# Patient Record
Sex: Female | Born: 1990 | Race: White | Marital: Single
Health system: Northeastern US, Community
[De-identification: ages and names within clinical notes are randomized; demographics above are authoritative.]

---

## 2015-06-24 ENCOUNTER — Emergency Department (HOSPITAL_BASED_OUTPATIENT_CLINIC_OR_DEPARTMENT_OTHER)
Admission: RE | Admit: 2015-06-24 | Disposition: A | Discharge status: Home / Self Care | Payer: Self-pay | Source: Emergency Department | Attending: Emergency Medicine | Admitting: Emergency Medicine

## 2015-06-24 MED ORDER — TETANUS-DIPHTH-ACELL PERTUSSIS 5-2.5-18.5 LF-MCG/0.5 IM SUSP
0.50 mL | Freq: Once | INTRAMUSCULAR | Status: AC
Start: 2015-06-24 — End: 2015-06-24
  Administered 2015-06-24: 0.5 mL via INTRAMUSCULAR
  Filled 2015-06-24: qty 0.5

## 2015-06-24 NOTE — Narrator Note (Signed)
Patient Disposition    Patient education for diagnosis, medications, activity, diet and follow-up.  Patient left ED 11:54 PM.  Patient rep received written instructions.  Interpreter to provide instructions: No    Patient belongings with patient: YES    Have all existing LDAs been addressed? N/A    Have all IV infusions been stopped? N/A    Discharged to: Discharged to home.  D/C instructions reviewed.  Pt voiced understanding.

## 2015-06-24 NOTE — ED Triage Note (Signed)
Ambulatory to ED requesting tetanus shot after cutting her hand on rusty fence today.  Pt unsure of when her last tetanus shot was.

## 2015-06-24 NOTE — ED Notes (Signed)
Bed: 23  Expected date:   Expected time:   Means of arrival:   Comments:  lorie

## 2015-06-24 NOTE — ED Provider Notes (Signed)
I have reviewed the ED nursing notes and prior records. I have reviewed the patient's past medical history/problem list, allergies, social history and medication list. I saw this patient primarily.    HPI:    This 24 year old female presents with chief complaint of puncture wound left hand.  Patient states that she was climbing over a fence and sustained a puncture to the palmar surface.  Patient states she cleanse this but is unsure of her tetanus immunization status.    ROS: Pertinent positives were reviewed as per the HPI above.    Past Medical History/Problem List:  No past medical history on file.  There is no problem list on file for this patient.    Past Surgical History:  No past surgical history on file.  Medications:     No current facility-administered medications on file prior to encounter.   No current outpatient prescriptions on file prior to encounter.  Social History:     Marital status: Single  Spouse name: N/A     Allergies:  Review of Patient's Allergies indicates:  No Known Allergies  Physical Exam:  BP 123/80  Pulse 97  Temp 98.3 F  Resp 16  LMP 06/13/2015 (Exact Date)  SpO2 97%  GENERAL:  Well-developed, alert & oriented x 4, no distress  SKIN:  Warm & Dry, no rash, no bruising.  There is a small puncture wound without active bleeding to the lower mid medial palmar surface.  No noted foreign body.  HEAD:  Atraumatic. PERRL.   NEUROLOGIC:  Normal speech. Normal gait and station.  No focal deficits.  PSYCHIATRIC:  Normal affect    ED Course and Medical Decision-making:  Patient is a [age] show female who sustained a puncture to her left palm.  Wound was cleansed.  Antibiotic ointment dressing was applied.  Patient was given TD IM and will follow-up with PCP or return here if problems.  Discharged with written instructions.  Reasons to return to the ED were reviewed in detail. The patient agrees with this plan and disposition.    Diagnosis/Diagnoses:  Puncture wound, hand, left, initial  encounter    Verna Czechhomas Danaisha Celli M.D.

## 2015-06-24 NOTE — Discharge Instructions (Signed)
Puncture Wound  A puncture wound is an injury that extends through all layers of the skin and into the tissue beneath the skin (subcutaneous tissue). Puncture wounds become infected easily because germs often enter the body and go beneath the skin during the injury. Having a deep wound with a small entrance point makes it difficult for your caregiver to adequately clean the wound. This is especially true if you have stepped on a nail and it has passed through a dirty shoe or other situations where the wound is obviously contaminated.  CAUSES   Many puncture wounds involve glass, nails, splinters, fish hooks, or other objects that enter the skin (foreign bodies). A puncture wound may also be caused by a human bite or animal bite.  DIAGNOSIS   A puncture wound is usually diagnosed by your history and a physical exam. You may need to have an X-ray or an ultrasound to check for any foreign bodies still in the wound.  TREATMENT    Your caregiver will clean the wound as thoroughly as possible. Depending on the location of the wound, a bandage (dressing) may be applied.   Your caregiver might prescribe antibiotic medicines.   You may need a follow-up visit to check on your wound. Follow all instructions as directed by your caregiver.  HOME CARE INSTRUCTIONS    Change your dressing once per day, or as directed by your caregiver. If the dressing sticks, it may be removed by soaking the area in water.   If your caregiver has given you follow-up instructions, it is very important that you return for a follow-up appointment. Not following up as directed could result in a chronic or permanent injury, pain, and disability.   Only take over-the-counter or prescription medicines for pain, discomfort, or fever as directed by your caregiver.   If you are given antibiotics, take them as directed. Finish them even if you start to feel better.  You may need a tetanus shot if:   You cannot remember when you had your last tetanus  shot.   You have never had a tetanus shot.  If you got a tetanus shot, your arm may swell, get red, and feel warm to the touch. This is common and not a problem. If you need a tetanus shot and you choose not to have one, there is a rare chance of getting tetanus. Sickness from tetanus can be serious.  You may need a rabies shot if an animal bite caused your puncture wound.  SEEK MEDICAL CARE IF:    You have redness, swelling, or increasing pain in the wound.   You have red streaks going away from the wound.   You notice a bad smell coming from the wound or dressing.   You have yellowish-white fluid (pus) coming from the wound.   You are treated with an antibiotic for infection, but the infection is not getting better.   You notice something in the wound, such as rubber from your shoe, cloth, or another object.   You have a fever.   You have severe pain.   You have difficulty breathing.   You feel dizzy or faint.   You cannot stop vomiting.   You lose feeling, develop numbness, or cannot move a limb below the wound.   Your symptoms worsen.  MAKE SURE YOU:   Understand these instructions.   Will watch your condition.   Will get help right away if you are not doing well or get worse.       This information is not intended to replace advice given to you by your health care provider. Make sure you discuss any questions you have with your health care provider.     Document Released: 05/15/2005 Document Revised: 10/28/2011 Document Reviewed: 09/28/2014  [employer] Interactive Patient Education 2016 [employer]

## 2022-02-06 IMAGING — MR MR SACRUM / SI JOINTS WO/W CM
6 of 8 series · 34 of 48 positions shown · IV contrast (multihance)
Comparison: None Available.

CLINICAL DATA: Left-sided back pain with left thigh numbness and
weakness after tennis injury 2 months ago. No prior surgery.

EXAM:
MRI SACRUM WITHOUT AND WITH CONTRAST
TECHNIQUE: Multiplanar and multiecho pulse sequences of the sacrum were
obtained without and with intravenous contrast.
CONTRAST:  13mL MULTIHANCE GADOBENATE DIMEGLUMINE 529 MG/ML IV SOLN

[Series 2: T1 · axial · right · 4.0mm · 0.52mm/px · z∈[-29,+116]mm · 7 of 30 slices shown (1 of 2)]
[im 1/30]
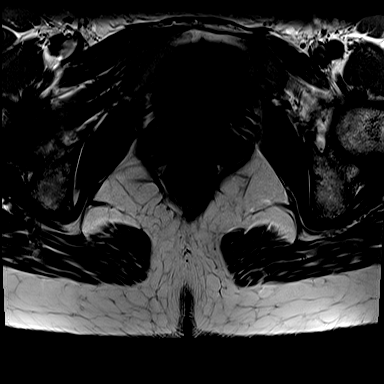
[im 5/30]
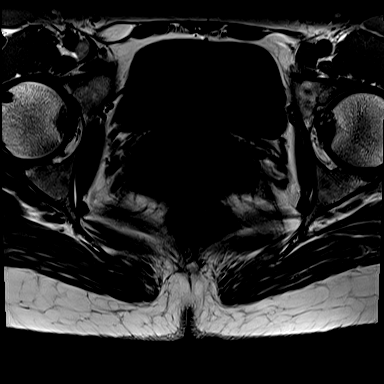
[im 10/30]
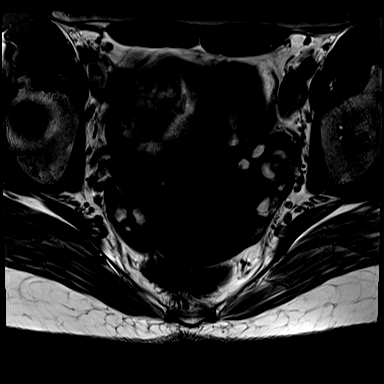
[im 15/30]
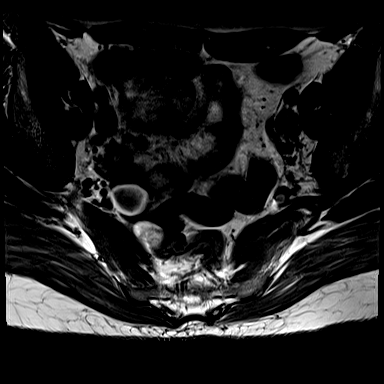
[im 20/30]
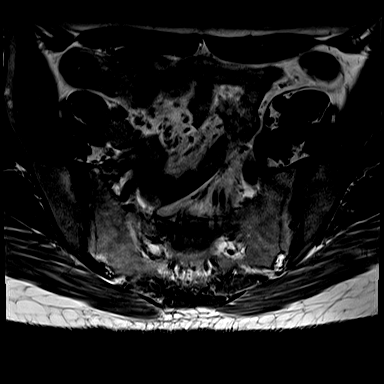
[im 25/30]
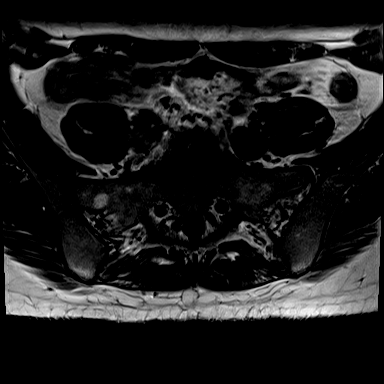
[im 30/30]
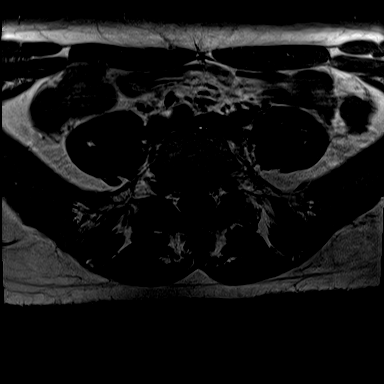

[Series 3: T2 fat-sat · axial · right · 4.0mm · 0.52mm/px · z∈[-38,+125]mm · 7 of 30 slices shown (1 of 2)]
[im 1/30]
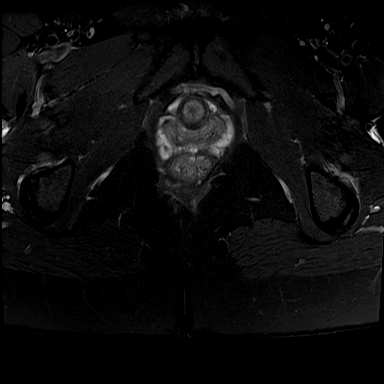
[im 5/30]
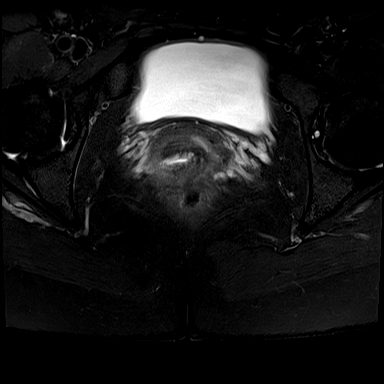
[im 10/30]
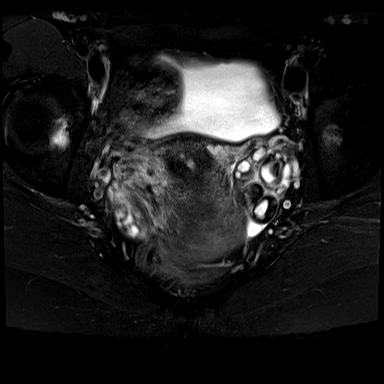
[im 15/30]
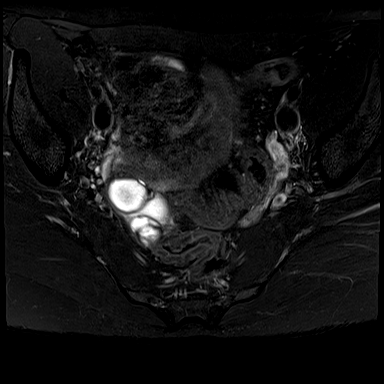
[im 20/30]
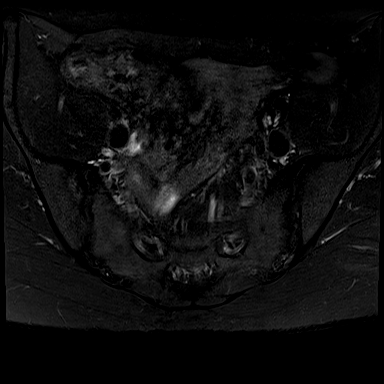
[im 25/30]
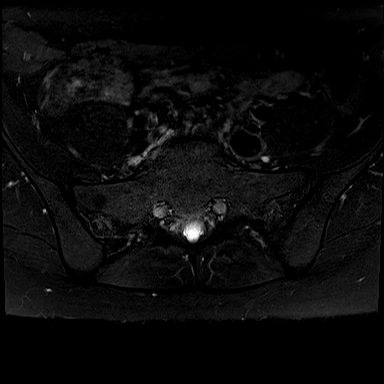
[im 30/30]
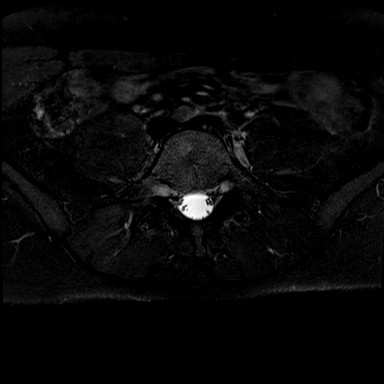

[Series 4: T2 fat-sat · sagittal · right · 4.0mm · 0.75mm/px · 7 of 30 slices shown (2 of 2)]
[im 1/30]
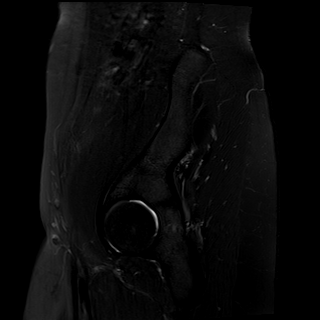
[im 5/30]
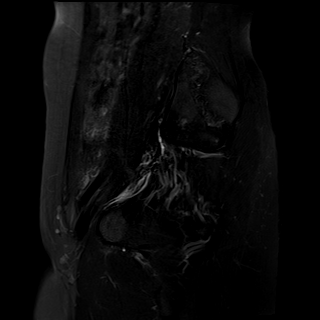
[im 10/30]
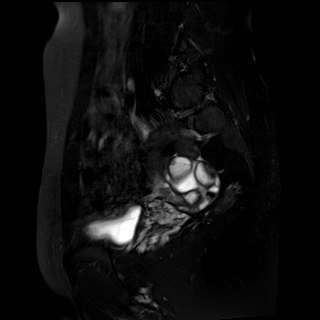
[im 15/30]
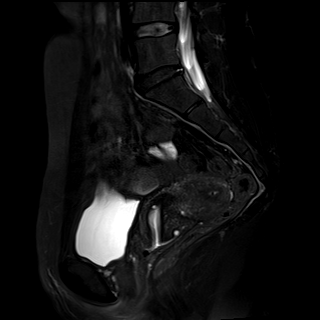
[im 20/30]
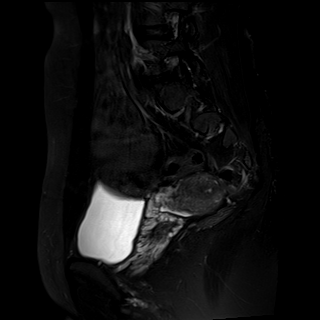
[im 25/30]
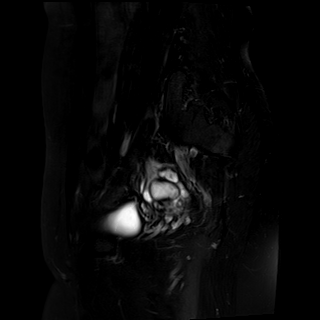
[im 30/30]
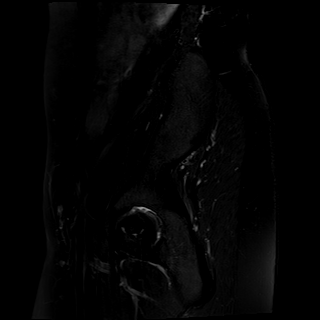

[Series 5: T1 · oblique · right · 4.0mm · 0.49mm/px · 5 of 25 slices shown (2 of 2)]
[im 1/25]
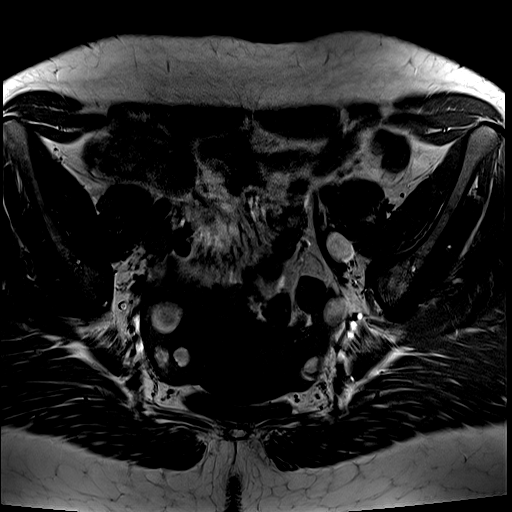
[im 7/25]
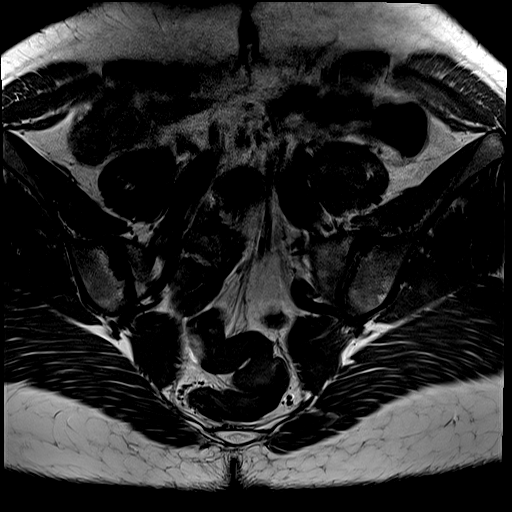
[im 13/25]
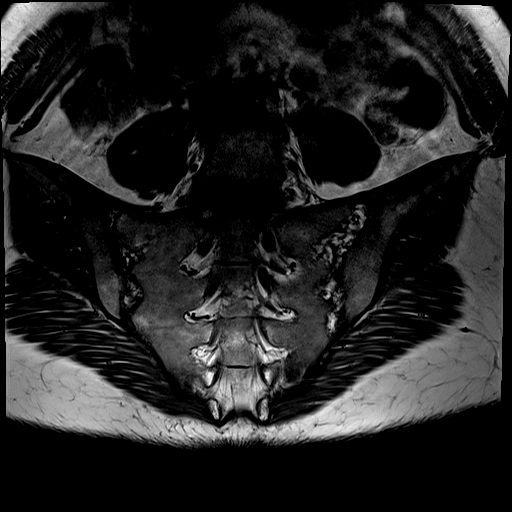
[im 19/25]
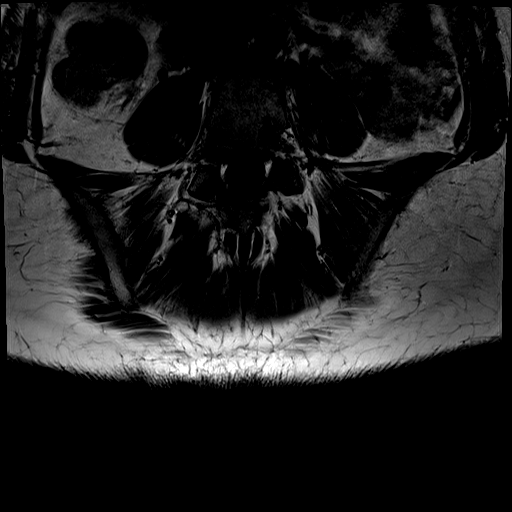
[im 25/25]
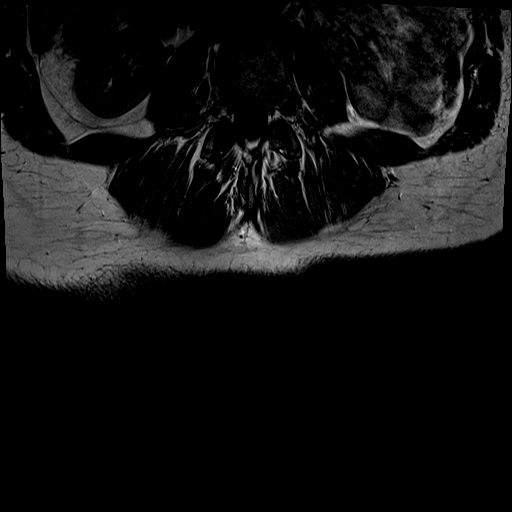

[Series 6: STIR · oblique · right · 4.0mm · 0.88mm/px · 5 of 25 slices shown]
[im 1/25]
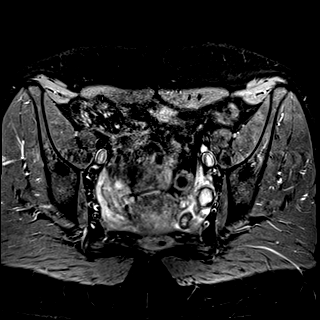
[im 7/25]
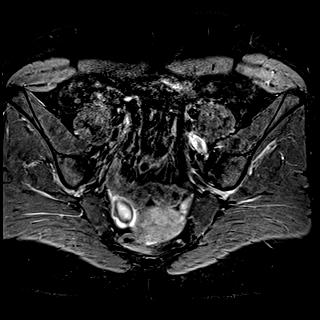
[im 13/25]
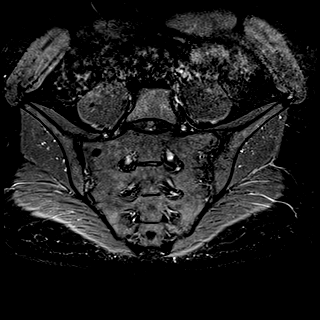
[im 19/25]
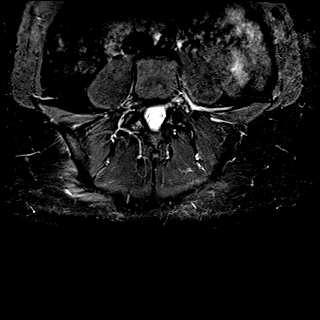
[im 25/25]
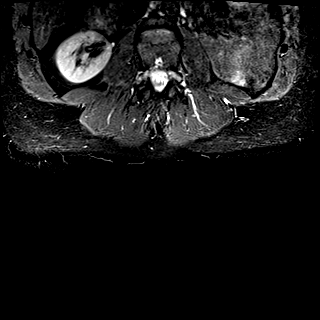

[Series 7: T1 fat-sat · axial · right · 4.0mm · 0.52mm/px · z∈[-29,+26]mm · 3 of 30 slices shown]
[im 1/30]
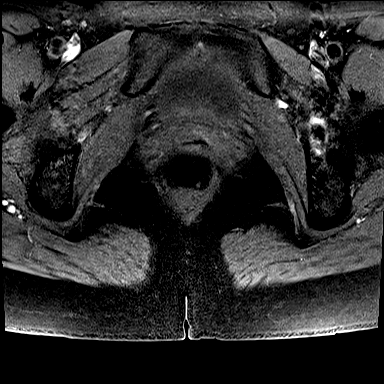
[im 6/30]
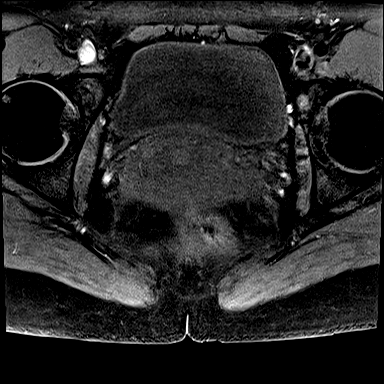
[im 12/30]
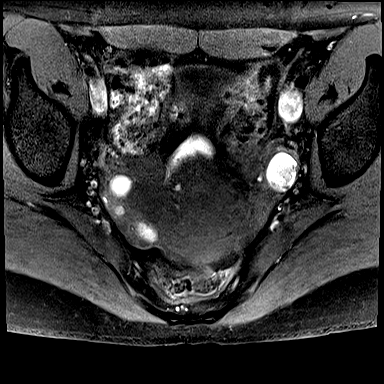

[34 of 48 positions shown; findings below may reference images not displayed]

FINDINGS: Bones/Joint/Cartilage

No suspicious marrow signal abnormality. No fracture or dislocation.
Very mild degenerative changes of the right sacroiliac joint
anteriorly with trace periarticular marrow edema. No joint effusion.
Small hemangioma in the right sacral ala.

Muscles and Tendons
Intact.  No muscle edema or atrophy.

Soft tissue
No fluid collection or hematoma. No soft tissue mass. Retroverted
uterus with appropriately positioned IUD.
IMPRESSION: 1. No acute abnormality of the sacrum.
2. Early degenerative changes of the right sacroiliac joint.

## 2022-02-06 IMAGING — MR MR LUMBAR SPINE WO/W CM
4 of 7 series · 19 of 48 positions shown · IV contrast (multihance)
Comparison: MRI lumbar spine report dated [DATE].

CLINICAL DATA: Left-sided back pain with left thigh numbness and
weakness after tennis injury 2 months ago. No prior surgery.

EXAM:
MRI LUMBAR SPINE WITHOUT AND WITH CONTRAST
TECHNIQUE: Multiplanar and multiecho pulse sequences of the lumbar spine were
obtained without and with intravenous contrast.
CONTRAST:  13mL MULTIHANCE GADOBENATE DIMEGLUMINE 529 MG/ML IV SOLN

[Series 5: T1 · sagittal · right · 4.0mm · 0.73mm/px · 3 of 15 slices shown (1 of 2)]
[im 1/15]
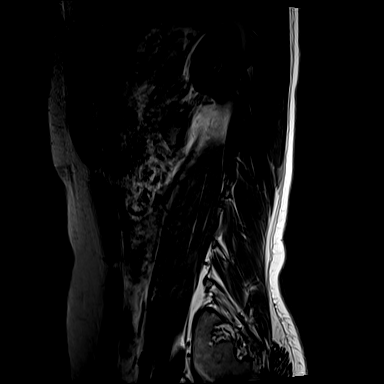
[im 8/15]
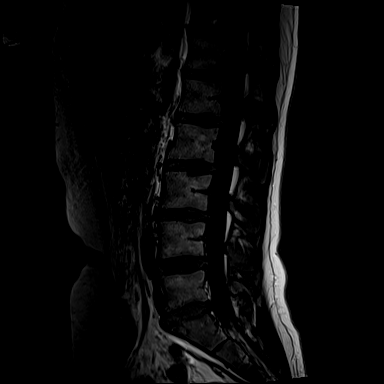
[im 15/15]
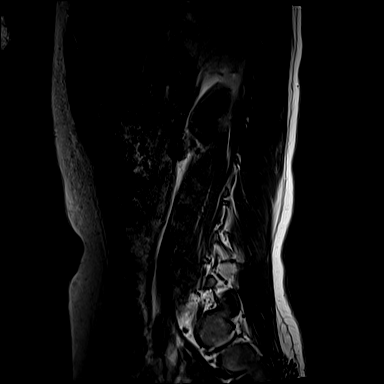

[Series 9: T1 · axial · right · 4.0mm · 0.28mm/px · z∈[+85,+273]mm · 3 of 44 slices shown (2 of 2)]
[im 5/44]
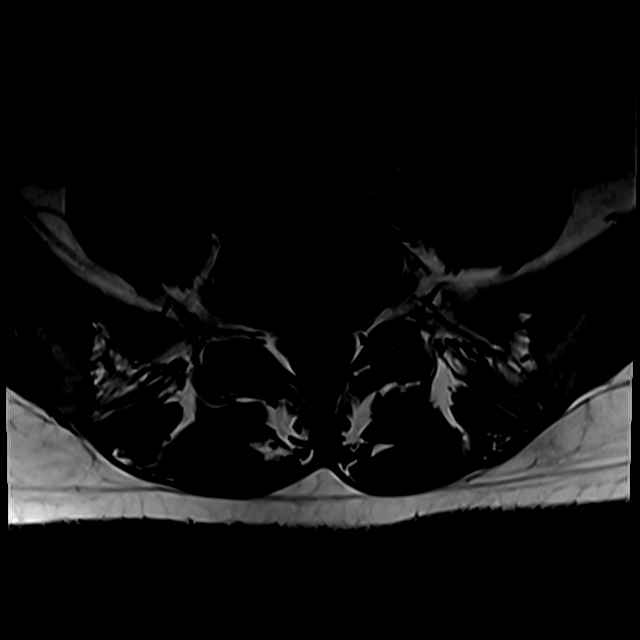
[im 22/44]
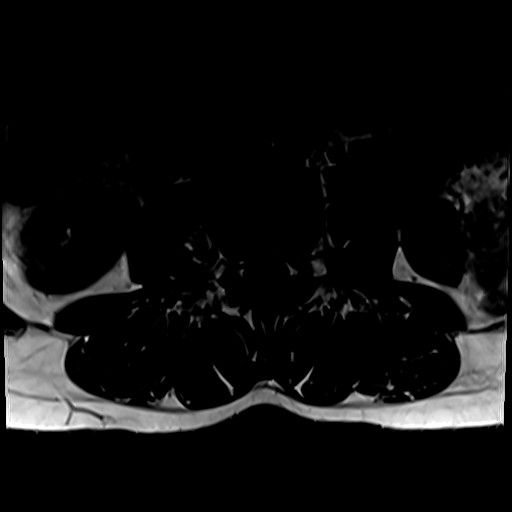
[im 39/44]
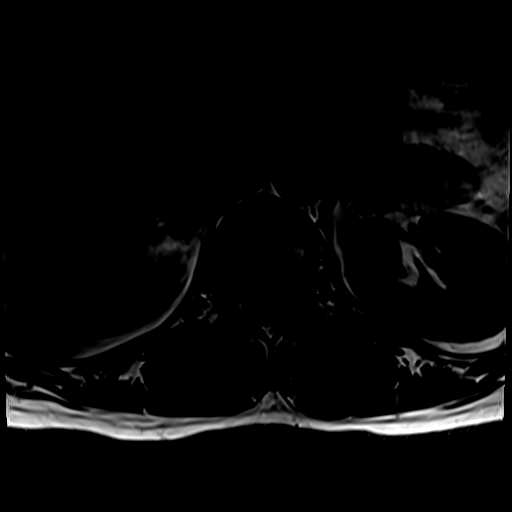

[Series 12: T2 · axial · right · 4.0mm · 0.28mm/px · z∈[+66,+273]mm · 10 of 44 slices shown (1 of 2)]
[im 1/44]
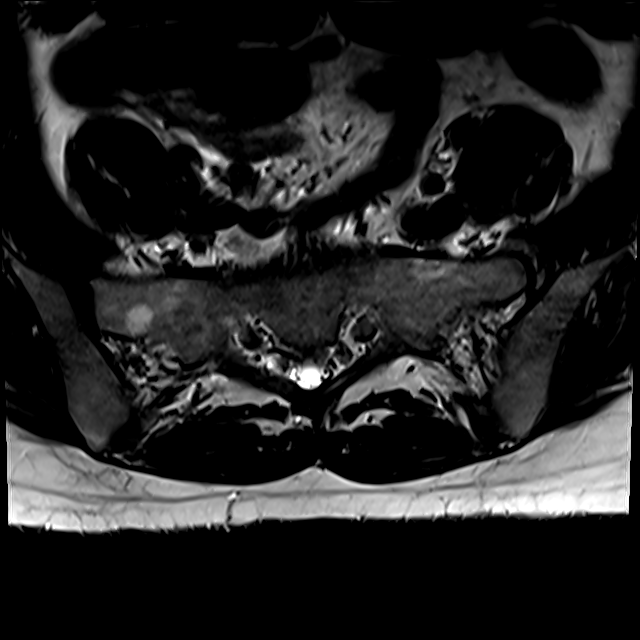
[im 5/44]
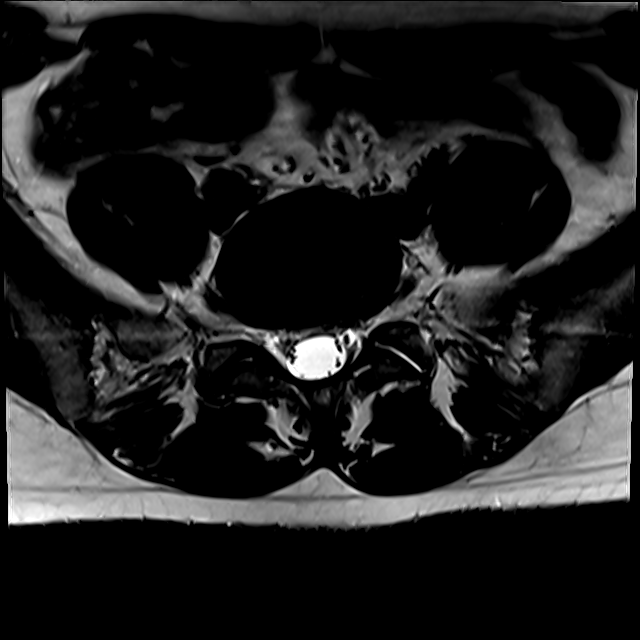
[im 9/44]
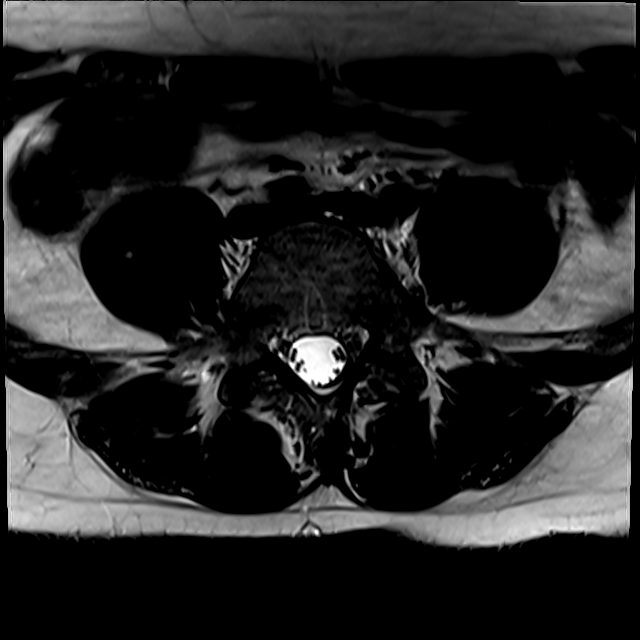
[im 13/44]
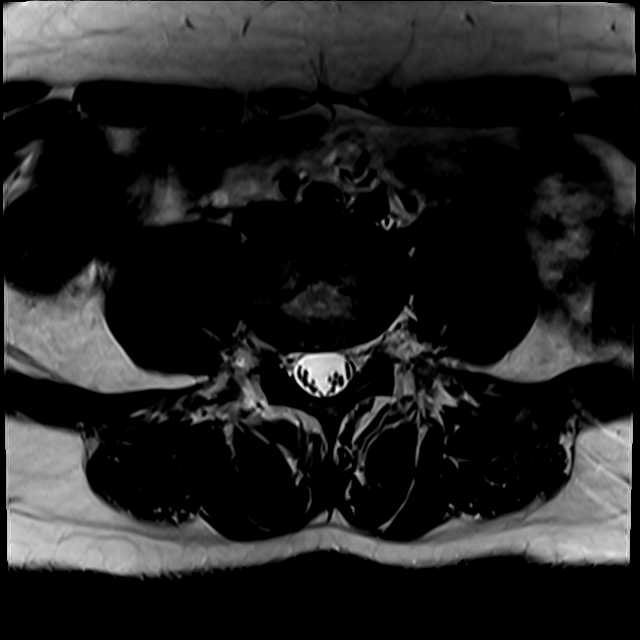
[im 18/44]
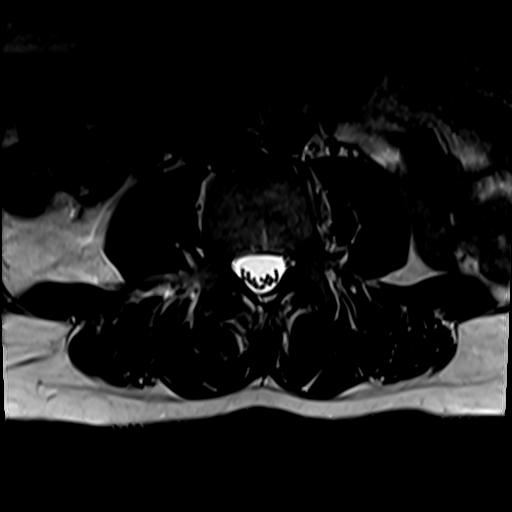
[im 22/44]
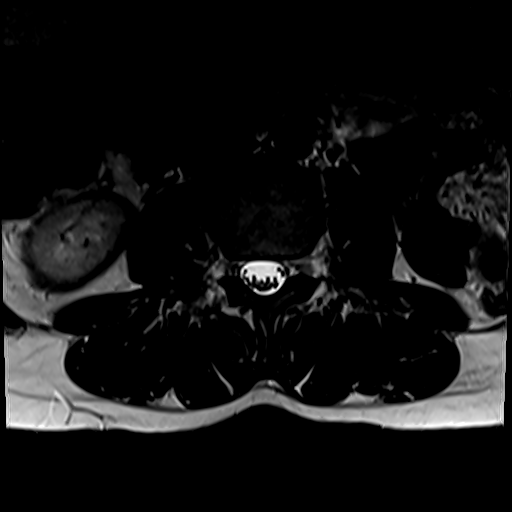
[im 26/44]
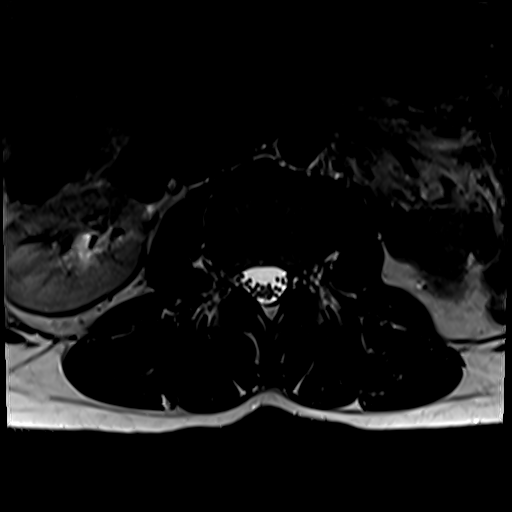
[im 31/44]
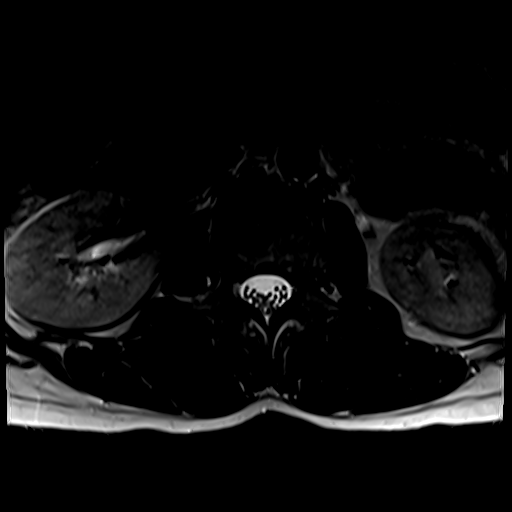
[im 35/44]
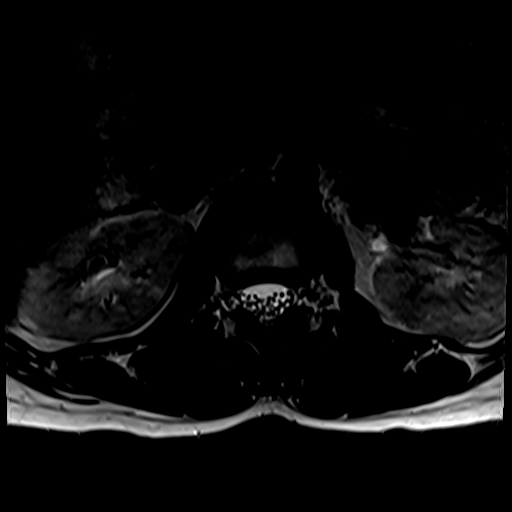
[im 39/44]
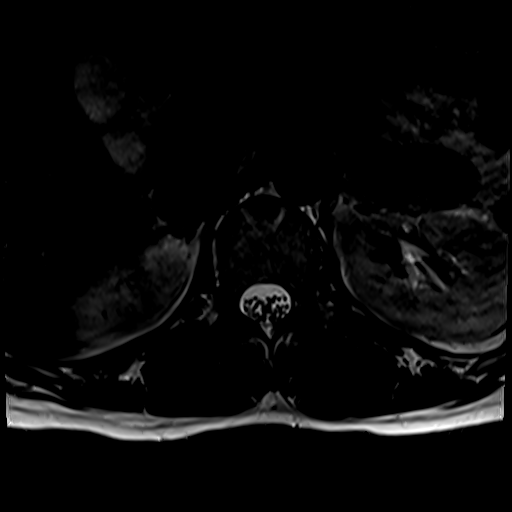

[Series 13: T2 · sagittal · right · 4.0mm · 0.73mm/px · 3 of 15 slices shown (2 of 2)]
[im 1/15]
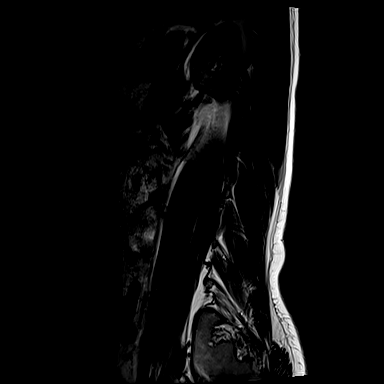
[im 10/15]
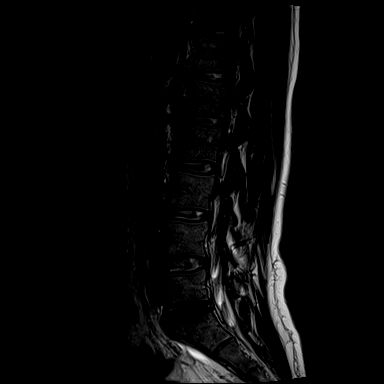
[im 15/15]
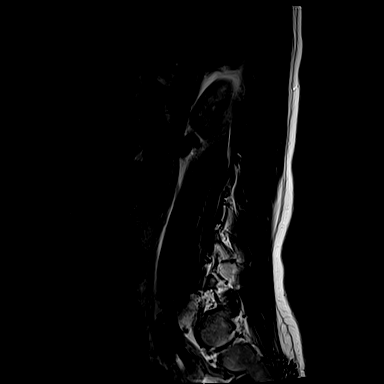

[19 of 48 positions shown; findings below may reference images not displayed]

FINDINGS: Segmentation:  Standard.

Alignment:  Physiologic.

Vertebrae:  No fracture, evidence of discitis, or bone lesion.

Conus medullaris and cauda equina: Conus extends to the L1 level.
Conus and cauda equina appear normal. No abnormal intrathecal
enhancement.

Paraspinal and other soft tissues: Negative.

Disc levels:

T11-T12 to L2-L3: Negative.

L3-L4: Small left foraminal/extraforaminal disc protrusion
contacting the exiting left L3 nerve root (series 9, image 25), new
by report. Borderline mild left neuroforaminal stenosis. No spinal
canal or right neuroforaminal stenosis.

L4-L5: Small right foraminal disc protrusion with mild right
neuroforaminal stenosis, unchanged by report. No spinal canal or
left neuroforaminal stenosis.

L5-S1: Minimal disc bulging with superimposed small central disc
protrusion and annular fissure. This has likely decreased in size
compared to the prior study based on the report. No stenosis.
IMPRESSION: 1. New small left foraminal/extraforaminal disc protrusion at L3-L4
contacting and likely irritating the exiting left L3 nerve root.
2. Small disc protrusions at L4-L5 and L5-S1 without stenosis or
impingement.
